# Patient Record
Sex: Male | Born: 1980 | Race: White | Hispanic: No
Health system: Southern US, Academic
[De-identification: ages and names within clinical notes are randomized; demographics above are authoritative.]

---

## 2021-04-06 IMAGING — MR MRI SHOULDER LT W/O CONTRAST
4 series · 40 of 40 positions shown · non-contrast
Comparison: none

------------- REPORT [id number] -------------
LOCKLEAR, BUBU

------------- REPORT [id number] -------------
﻿
Pertinent Hx:  Fell in February.  Shoulder pain.   Injury 04/20/2021.
TECHNIQUE: Proton density and T2-weighted coronal oblique images of the shoulder were taken.  Proton density sagittal oblique and axial images were also obtained.

[Series 4: PD fat-sat · axial · 4.0mm · 0.55mm/px · z∈[-68,+31]mm · 11 of 23 slices shown (1 of 3)]
[im 1/23]
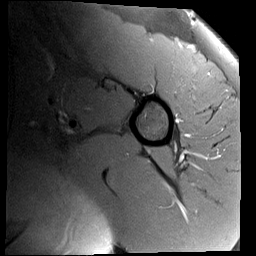
[im 3/23]
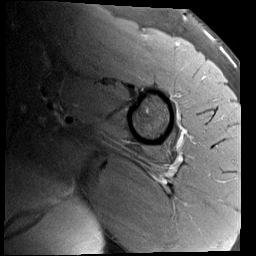
[im 5/23]
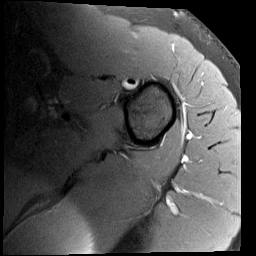
[im 7/23]
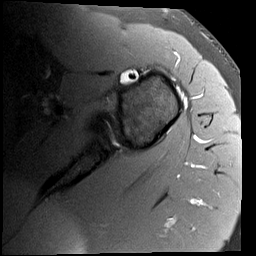
[im 9/23]
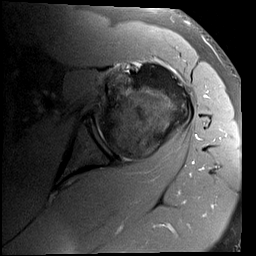
[im 12/23]
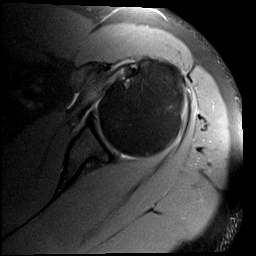
[im 14/23]
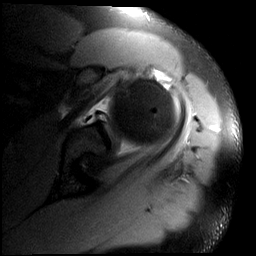
[im 16/23]
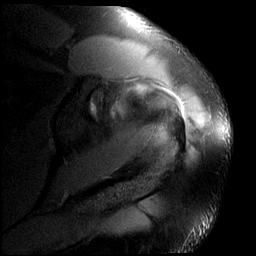
[im 18/23]
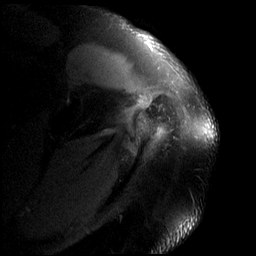
[im 20/23]
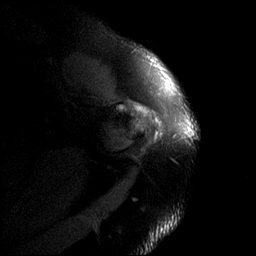
[im 23/23]
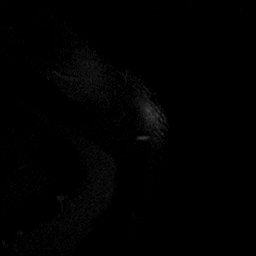

[Series 7: T2 · oblique · 4.0mm · 0.55mm/px · 9 of 19 slices shown]
[im 1/19]
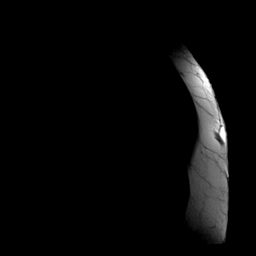
[im 3/19]
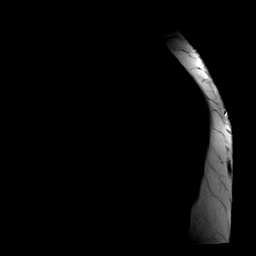
[im 5/19]
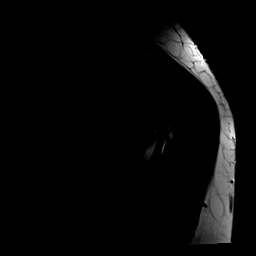
[im 7/19]
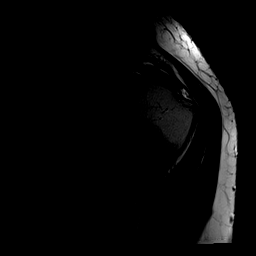
[im 10/19]
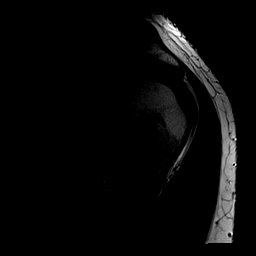
[im 12/19]
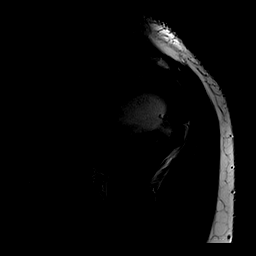
[im 14/19]
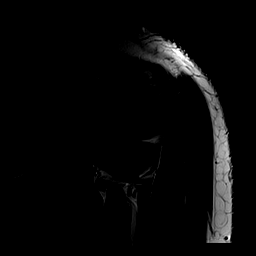
[im 16/19]
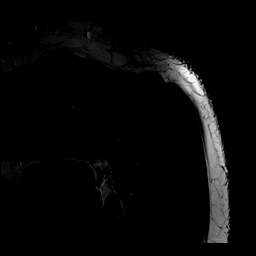
[im 19/19]
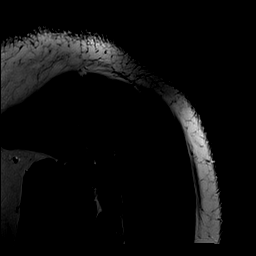

[Series 8: PD fat-sat · oblique · 4.0mm · 0.44mm/px · 10 of 19 slices shown (2 of 3)]
[im 1/19]
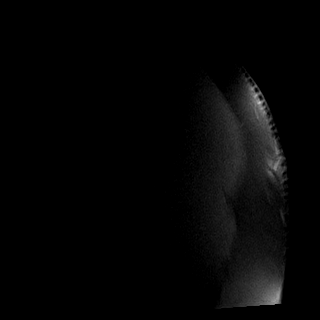
[im 3/19]
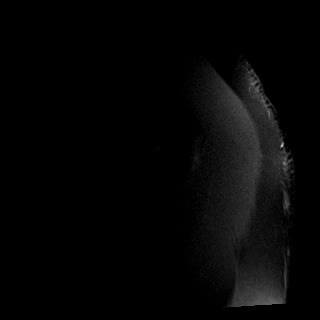
[im 5/19]
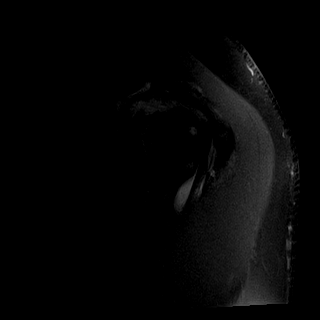
[im 7/19]
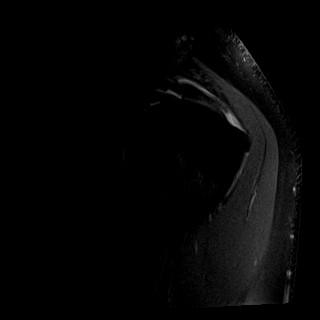
[im 9/19]
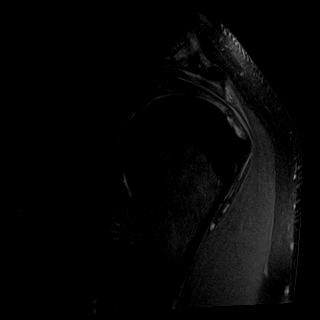
[im 11/19]
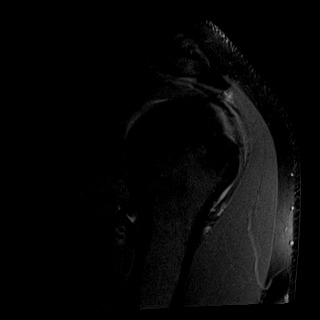
[im 13/19]
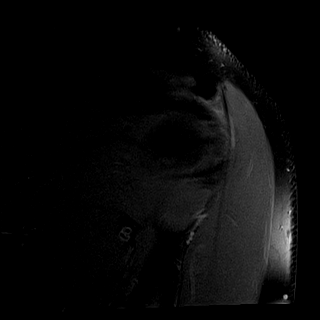
[im 15/19]
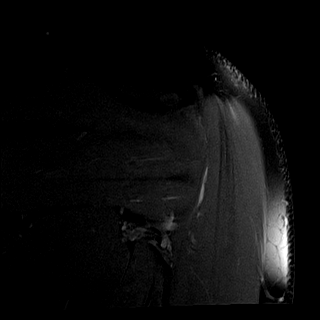
[im 17/19]
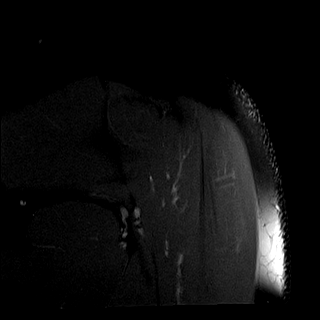
[im 19/19]
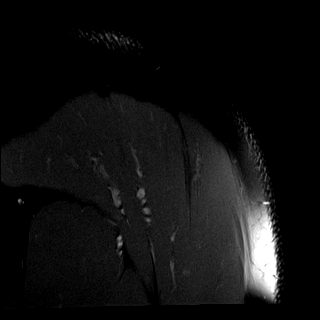

[Series 9: PD fat-sat · oblique · 4.0mm · 0.55mm/px · 10 of 19 slices shown (3 of 3)]
[im 1/19]
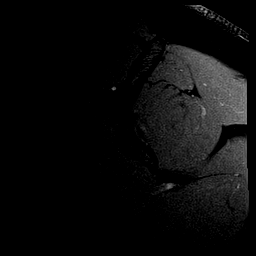
[im 3/19]
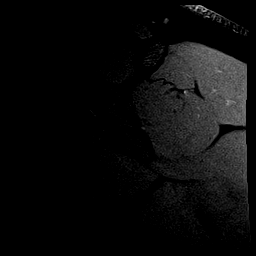
[im 5/19]
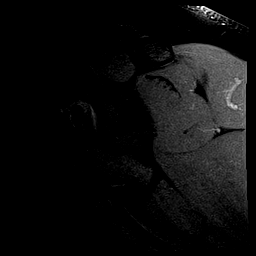
[im 7/19]
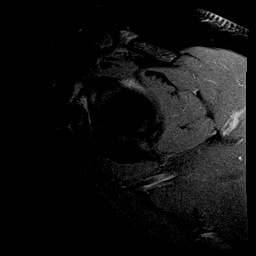
[im 9/19]
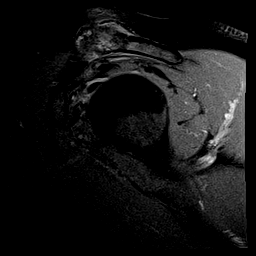
[im 11/19]
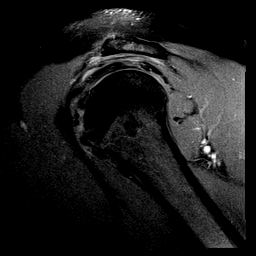
[im 13/19]
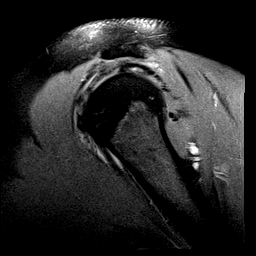
[im 15/19]
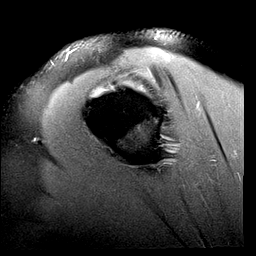
[im 17/19]
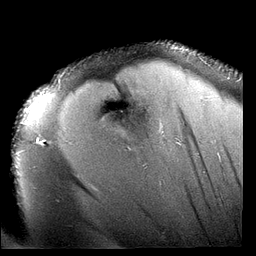
[im 19/19]
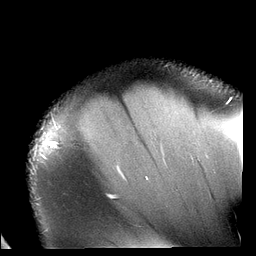

[40 of 40 positions shown; findings below may reference images not displayed]

FINDINGS: There is a full thickness tear of the supraspinatus tendon.  The tendon is torn from the humeral attachment and retracted about 1-1.5 cm.  

There is fluid in the subacromial-subdeltoid bursa.  There is a small amount of fluid within the shoulder joint capsule. 

There is abnormal signal within the subscapularis tendon consistent with tendinopathy.  There is a partial tear of the subscapularis tendon at the humeral attachment.  This does not appear to be complete. 

No other rotator cuff tendon tear can be identified.

No labra tear can be identified.  The biceps tendon is normal.  

There is a small focus of bone marrow edema in the lesser tuberosity of the humerus.  No fracture identified.  No other marrow signal changes.  

There are acromioclavicular joint degenerative changes.  The glenohumeral joint appears normal.  

There is no muscular atrophy.
IMPRESSION: 1. Rotator cuff tear.  There is a full thickness tear of the supraspinatus tendon with the torn tendon retracted 1-1.5 cm.  There is evidence of subscapularis tendinopathy and there is a partial tear of the subscapularis tendon.  

2. Fluid in the subacromial-subdeltoid bursa.  Small amount of fluid within the shoulder joint capsule.

## 2022-08-28 IMAGING — MR MRI THORACIC SPINE WITHOUT CONTRAST
8 series · 42 of 48 positions shown · non-contrast
Comparison: none

﻿

Pertinent Hx:    Fell off a truck striking mid back.  Injury 07/08/2022.
TECHNIQUE: T1 and T2 sagittal, T2 axial from T1 to T12 as well as gradient echo imaging performed through the thoracic spine.

[Series 1: cervical loc w/table · sagittal · 3.0mm · 1.25mm/px · 2 of 15 slices shown]
[im 1/15]
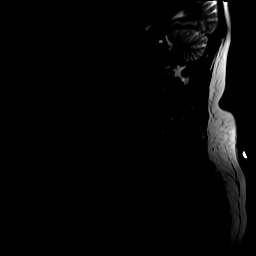
[im 15/15]
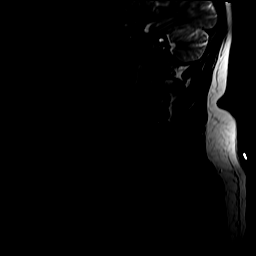

[Series 2: sagittal thoracic loc · sagittal · 3.0mm · 1.25mm/px · 3 of 13 slices shown]
[im 1/13]
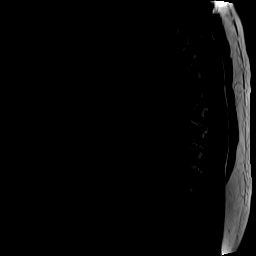
[im 7/13]
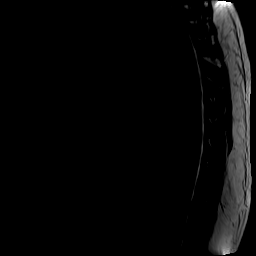
[im 13/13]
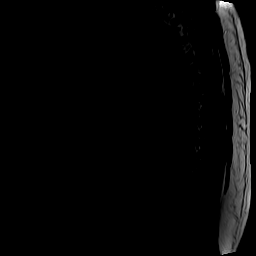

[Series 3: sag/cor/axial loc · axial · 3.0mm · 1.48mm/px · z∈[-230,-28]mm · 4 of 21 slices shown]
[im 1/21]
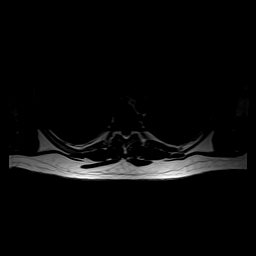
[im 7/21]
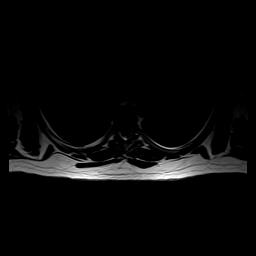
[im 14/21]
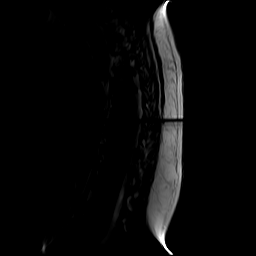
[im 21/21]
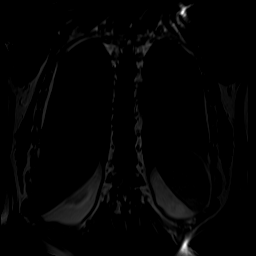

[Series 4: T2 · sagittal · 3.0mm · 1.00mm/px · 4 of 19 slices shown (1 of 2)]
[im 1/19]
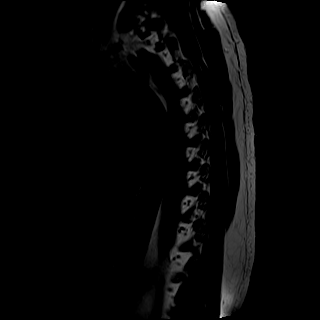
[im 7/19]
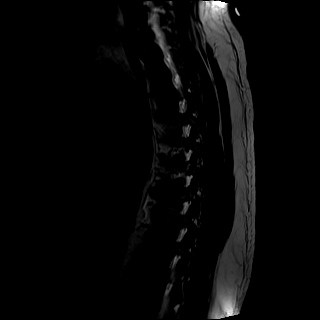
[im 13/19]
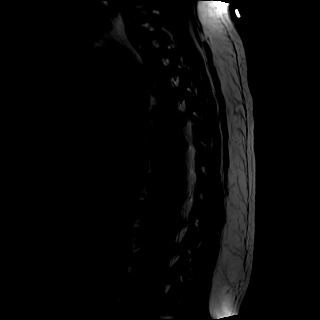
[im 19/19]
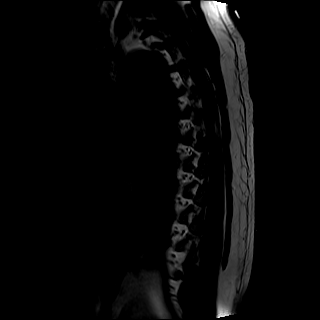

[Series 5: T2 · axial · 5.0mm · 0.70mm/px · z∈[-310,-35]mm · 13 of 62 slices shown (2 of 2)]
[im 1/62]
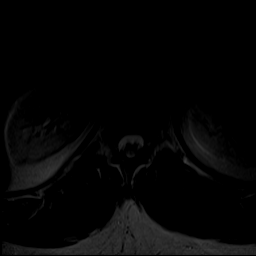
[im 6/62]
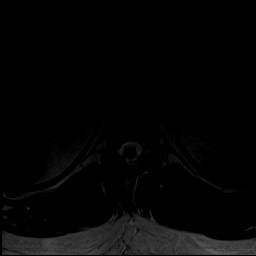
[im 11/62]
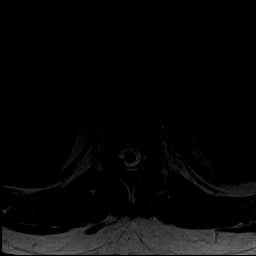
[im 16/62]
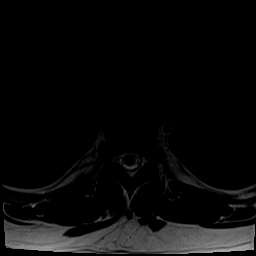
[im 21/62]
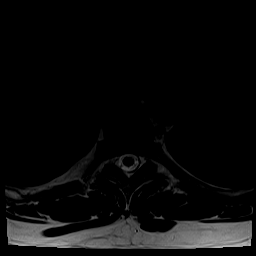
[im 26/62]
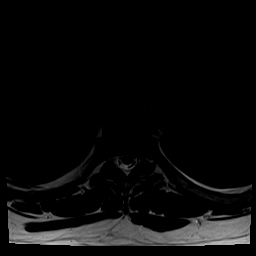
[im 31/62]
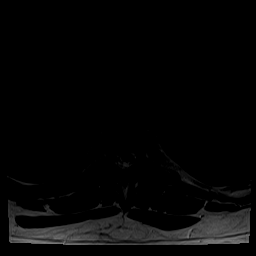
[im 36/62]
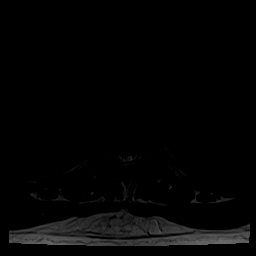
[im 41/62]
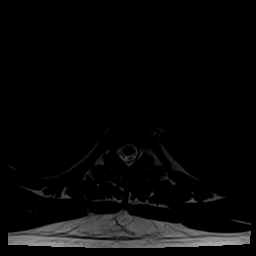
[im 46/62]
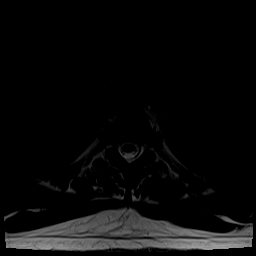
[im 51/62]
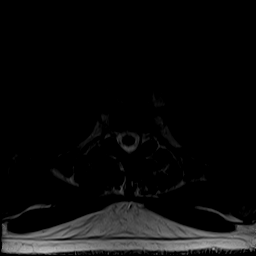
[im 56/62]
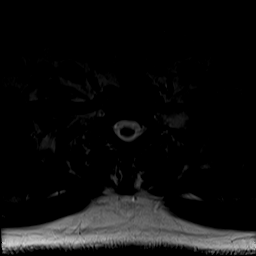
[im 62/62]
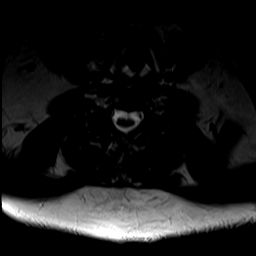

[Series 6: STIR · sagittal · 3.0mm · 1.00mm/px · 4 of 19 slices shown]
[im 1/19]
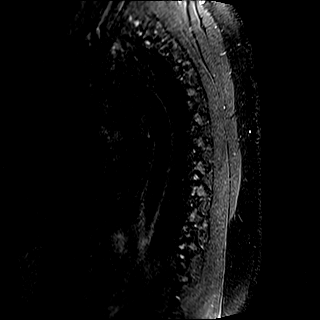
[im 7/19]
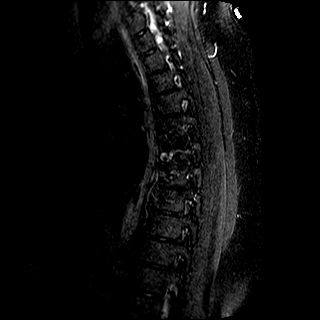
[im 13/19]
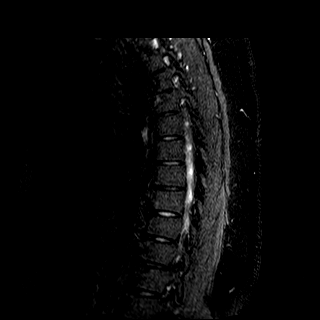
[im 19/19]
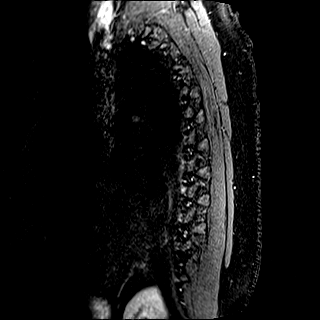

[Series 7: T1 · sagittal · 3.0mm · 1.00mm/px · 4 of 19 slices shown (1 of 2)]
[im 1/19]
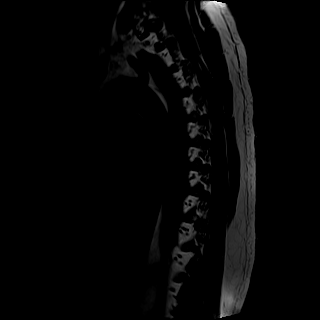
[im 7/19]
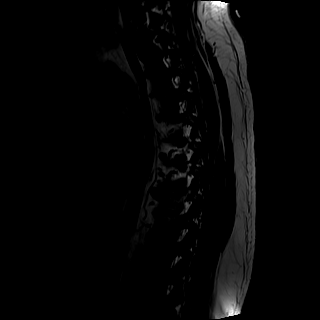
[im 13/19]
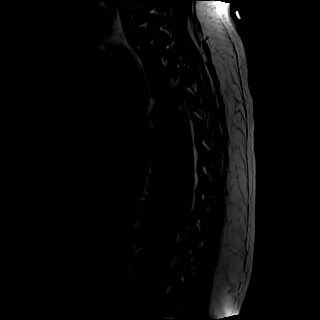
[im 19/19]
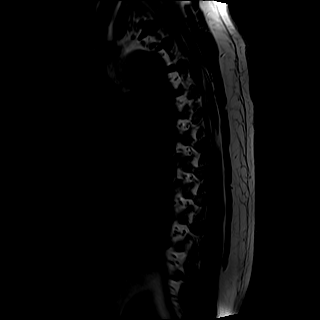

[Series 8: T1 · axial · 5.0mm · 0.35mm/px · z∈[-344,-35]mm · 8 of 69 slices shown (2 of 2)]
[im 1/69]
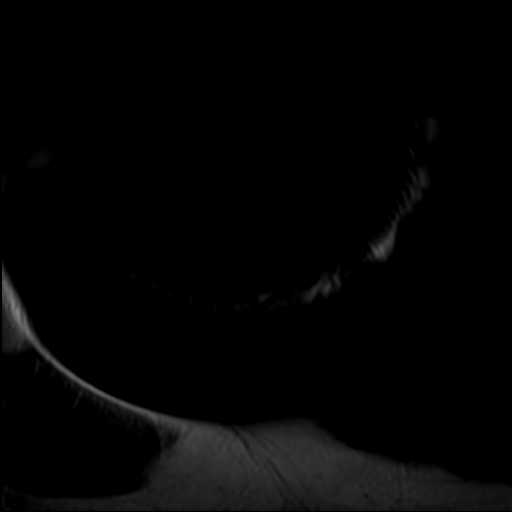
[im 11/69]
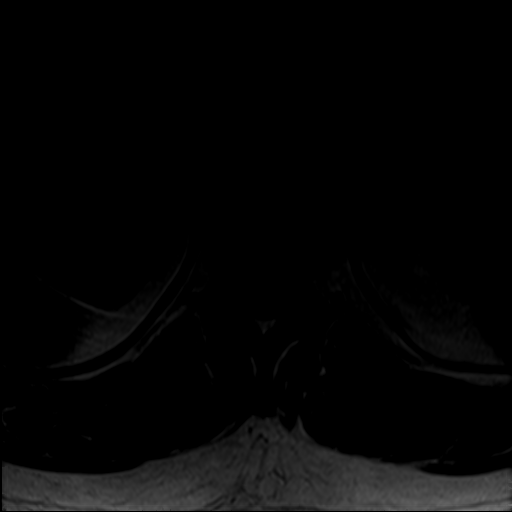
[im 21/69]
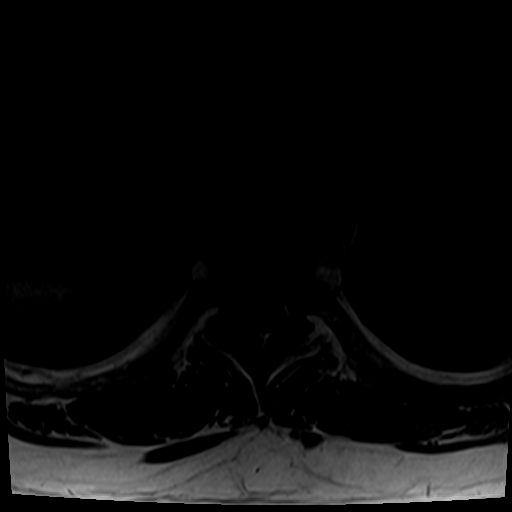
[im 32/69]
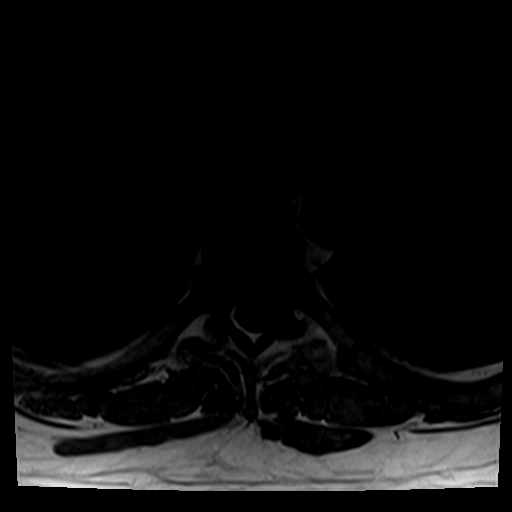
[im 37/69]
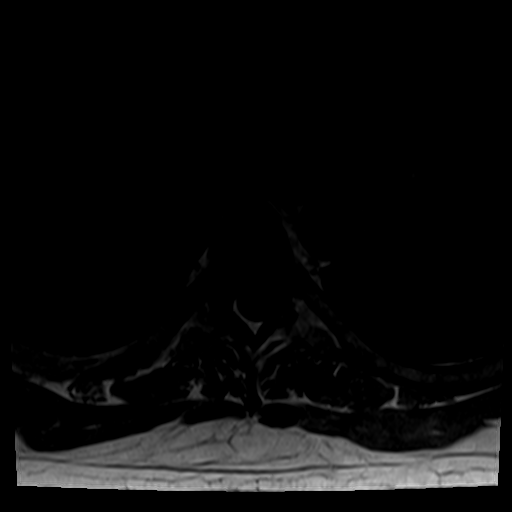
[im 48/69]
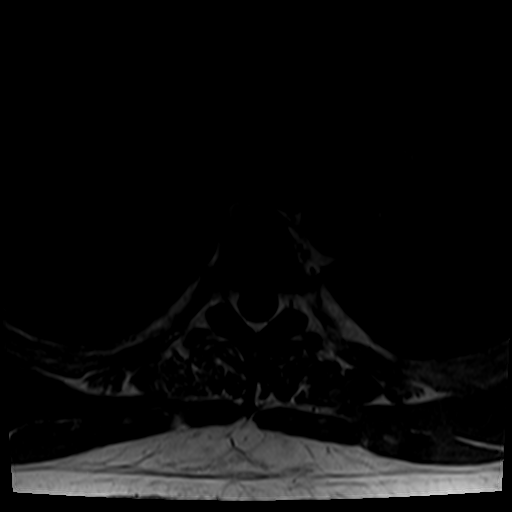
[im 58/69]
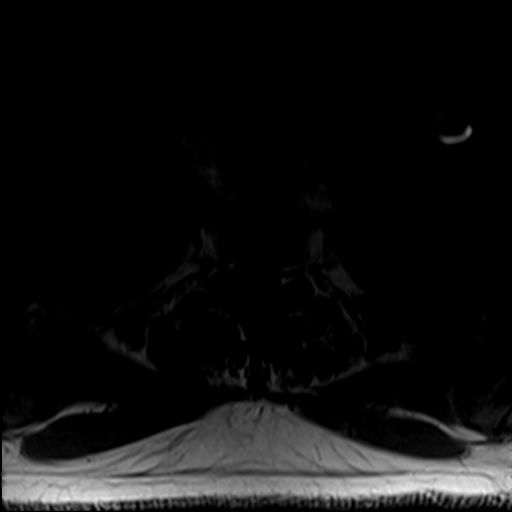
[im 69/69]
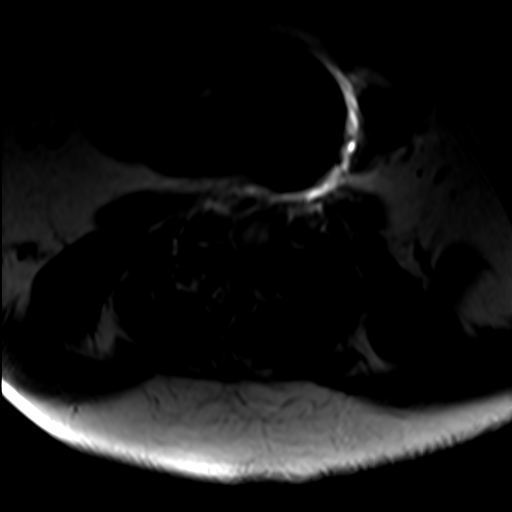

[42 of 48 positions shown; findings below may reference images not displayed]

FINDINGS: There is no canal stenosis. There is normal alignment. There is no disc degeneration. There is no evidence of disc herniation. The spinal cord is normal. 

At C6-7 there is a biforaminal disc herniation, left greater than right, due to hypertrophy of the uncovertebral joint.  See image 2 series 5.  A complete MRI cervical spine is recommended.
IMPRESSION: Normal MRI of the thoracic spine without contrast.  At C6-7 there is a biforaminal disc herniation, left greater than right.  Cervical spine MRI is recommended.

## 2023-03-24 ENCOUNTER — Emergency Department (HOSPITAL_COMMUNITY): Payer: 59

## 2023-03-24 ENCOUNTER — Other Ambulatory Visit: Payer: Self-pay

## 2023-03-24 ENCOUNTER — Emergency Department
Admission: EM | Admit: 2023-03-24 | Discharge: 2023-03-24 | Disposition: A | Discharge status: Home / Self Care | Payer: 59 | Attending: Emergency Medicine | Admitting: Emergency Medicine

## 2023-03-24 DIAGNOSIS — Z23 Encounter for immunization: Secondary | ICD-10-CM | POA: Insufficient documentation

## 2023-03-24 DIAGNOSIS — S61215A Laceration without foreign body of left ring finger without damage to nail, initial encounter: Secondary | ICD-10-CM | POA: Insufficient documentation

## 2023-03-24 DIAGNOSIS — M25742 Osteophyte, left hand: Secondary | ICD-10-CM | POA: Insufficient documentation

## 2023-03-24 DIAGNOSIS — W240XXA Contact with lifting devices, not elsewhere classified, initial encounter: Secondary | ICD-10-CM | POA: Insufficient documentation

## 2023-03-24 DIAGNOSIS — S61219A Laceration without foreign body of unspecified finger without damage to nail, initial encounter: Secondary | ICD-10-CM

## 2023-03-24 DIAGNOSIS — Y990 Civilian activity done for income or pay: Secondary | ICD-10-CM | POA: Insufficient documentation

## 2023-03-24 MED ORDER — DIPHTH,PERTUSSIS(ACELL),TETANUS 2.5 LF UNIT-8 MCG-5 LF/0.5 ML IM SUSP
0.5000 mL | INTRAMUSCULAR | Status: AC
Start: 2023-03-24 — End: 2023-03-24
  Administered 2023-03-24: 0.5 mL via INTRAMUSCULAR
  Filled 2023-03-24: qty 0.5

## 2023-03-24 MED ORDER — LIDOCAINE HCL 10 MG/ML (1 %) INJECTION SOLUTION
15.0000 mL | INTRAMUSCULAR | Status: AC
Start: 2023-03-24 — End: 2023-03-24
  Administered 2023-03-24: 150 mg via INTRADERMAL
  Filled 2023-03-24: qty 20

## 2023-03-24 MED ORDER — CEPHALEXIN 500 MG CAPSULE
500.0000 mg | ORAL_CAPSULE | Freq: Four times a day (QID) | ORAL | 0 refills | Status: AC
Start: 2023-03-24 — End: 2023-03-29

## 2023-03-24 MED ORDER — BACITRACIN 500 UNIT/GRAM TOPICAL PACKET
1.0000 | PACK | CUTANEOUS | Status: DC
Start: 2023-03-24 — End: 2023-03-24
  Filled 2023-03-24: qty 1

## 2023-03-24 NOTE — ED Nurses Note (Signed)
Clean wound with normal saline.  Applied bacitracin to wound area.   Non adherent pad place on wound.  Co band applied to secure.      Marland Kitchen.Patient discharged home with family.  AVS reviewed with patient/care giver.  A written copy of the AVS and discharge instructions was given to the patient/care giver.  Questions sufficiently answered as needed.  Patient/care giver encouraged to follow up with PCP as indicated.  In the event of an emergency, patient/care giver instructed to call 911 or go to the nearest emergency room.       .     Current Discharge Medication List        START taking these medications.        Details   cephalexin 500 mg Capsule  Commonly known as: KEFLEX   500 mg, Oral, 4 TIMES DAILY  Qty: 20 Capsule  Refills: 0

## 2023-03-24 NOTE — ED Provider Notes (Addendum)
Dr. Si Gaul. [facility]          Emergency Department Visit Note    Date of Service: 03/24/2023  Primary Care Doctor:No Pcp  Patient information was obtained from patient.  History/Exam limitations: none.  Patient presented to the Emergency Department by private vehicle.    Chief Complaint:  Finger laceration    HPI: The patient is a(an) 42 y.o. male.     Forklift penetrated left 4th finger at work today.  Tetanus not up-to-date.  Bleeding is controlled.  Pain worse with movement.  Better with rest.      Review of Systems:    The pertinent positive and negative symptoms are as per HPI. All other systems reviewed and are negative.      Past Medical History:  No past medical history on file.        Past Surgical History:  No past surgical history pertinent negatives on file.        Family History:  Family Medical History:    None            Social History:     Social History     Substance and Sexual Activity   Drug Use Not on file       Current Outpatient Medications:   Outpatient Medications Marked as Taking for the 03/24/23 encounter [facility])   Medication Sig    cephalexin (KEFLEX) 500 mg Oral Capsule Take 1 Capsule (500 mg total) by mouth Four times a day for 5 days       Allergies:   No Known Allergies    Physical Exam     Vital Signs:  Filed Vitals:    03/24/23 1629 03/24/23 1630   BP: (!) 148/101    Pulse: 98    Resp: 17    Temp:  36.3 C (97.3 F)   SpO2: 97%        The initial visit vital signs are reviewed as above.      Physical Exam:   General: No apparent acute distress. Very pleasant.   Eyes: Conjunctiva are clear. Pupils are equal, round  HENT: Mucous membranes are moist. Nares are clear.   Extremities:  4th digit left hand:  3 cm laceration to the side of the finger, full range of motion of the finger, neurovascularly intact distally  Skin: Warm and dry without rashes  Neurologic: Strength and sensation grossly normal throughout.  Psychiatric: Alert       Diagnostics        Radiology:  XR FINGER, 4TH/RING LEFT,   Results for orders placed or performed during the hospital encounter of 03/24/23 (from the past 72 hour(s))   XR FINGER, 4TH/RING LEFT     Status: None    Narrative    PROCEDURE DESCRIPTION: XR FINGER, 4TH/RING LEFT    CLINICAL INDICATION: injury    TECHNIQUE: 3 views / 3 images submitted.    COMPARISON: None.      Impression    FINDINGS/IMPRESSION: No acute fracture or dislocation identified. Mild degenerative change at the fourth PIP joint with small osteophytes.                Radiologist location ID: [id number]       , interpreted by radiologist and independently reviewed by me.    ED Course                Summary/ Plan:   Orders Placed  This Encounter    XR FINGER, 4TH/RING LEFT    diphtheria, pertussis-acell, tetanus (BOOSTRIX) IM injection    lidocaine 1% injection    bacitracin 500 units/gram topical ointment packet    cephalexin (KEFLEX) 500 mg Oral Capsule     Procedure note simple  laceration repair   Verbal consent was obtained.  Area prepped with Betadine.  Sterile technique used throughout.  The wound was irrigated with normal saline and closed with 4 interrupted sutures    Medical Decision Making  Problems Addressed:  Finger laceration: acute illness or injury    Amount and/or Complexity of Data Reviewed  Radiology: ordered and independent interpretation performed.    Risk  OTC drugs.  Prescription drug management.        Clinical Impression   Finger laceration (Primary)       Disposistion  1) Discharge home : Stable  2) Return to ED with worsening conditions or any new concerns  3) Strict return precautions given to patient  4) Patient understood and agrees with the plan    Filed Vitals:    03/24/23 1629 03/24/23 1630   BP: (!) 148/101    Pulse: 98    Resp: 17    Temp:  36.3 C (97.3 F)   SpO2: 97%          Prescriptions:  Current Discharge Medication List        START taking these medications    Details   cephalexin (KEFLEX) 500 mg Oral Capsule Take 1  Capsule (500 mg total) by mouth Four times a day for 5 days  Qty: 20 Capsule, Refills: 0             Follow up:  [facility] - Emergency Department  [facility]  [place] [address]  [phone]  Go in 1 week  For suture removal      Fatima Blank, MD 03/24/2023, 18:26         Portions of this note may be dictated using voice recognition software or a dictation service. Variances in spelling and vocabulary are possible and unintentional. Not all errors are caught/corrected. Please notify the Thereasa Parkin if any discrepancies are noted or if the meaning of any statement is not clear.

## 2023-03-24 NOTE — ED Triage Notes (Signed)
Pt reports forklift forks penetrated 4th finger on left hand today. Deep laceration noted. Bleeding controlled in triage. Needs tetanus.
